# Patient Record
Sex: Male | Born: 1970 | Hispanic: Yes | Marital: Single
Health system: Midwestern US, Community
[De-identification: ages and names within clinical notes are randomized; demographics above are authoritative.]

---

## 2020-02-05 NOTE — Progress Notes (Signed)
Formatting of this note might be different from the original.    Self Swab Type: Anterior Nasal  Electronically signed by Talbert Cage at 02/05/2020 11:02 AM EST

## 2020-12-08 IMAGING — CR [HOSPITAL] ORBITS
2 series · 2 of 2 positions shown · non-contrast
Comparison: None available.

INDICATION: Metal screening.  MRI clearance.
TECHNIQUE: Two views of the orbits are obtained.

[w waters pa]
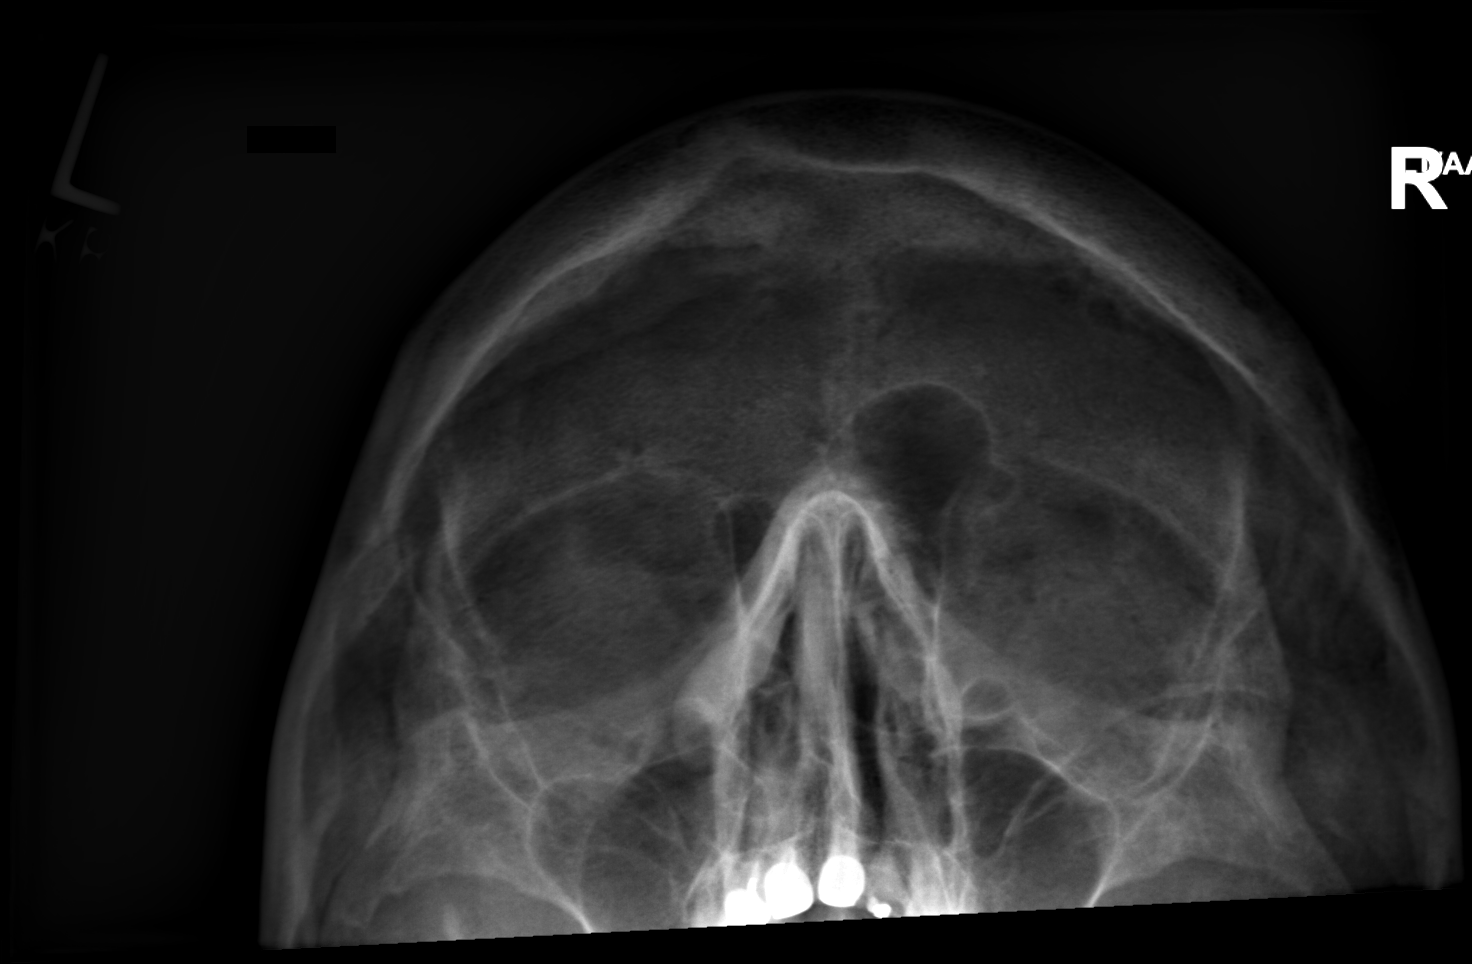

[w orbits lat]
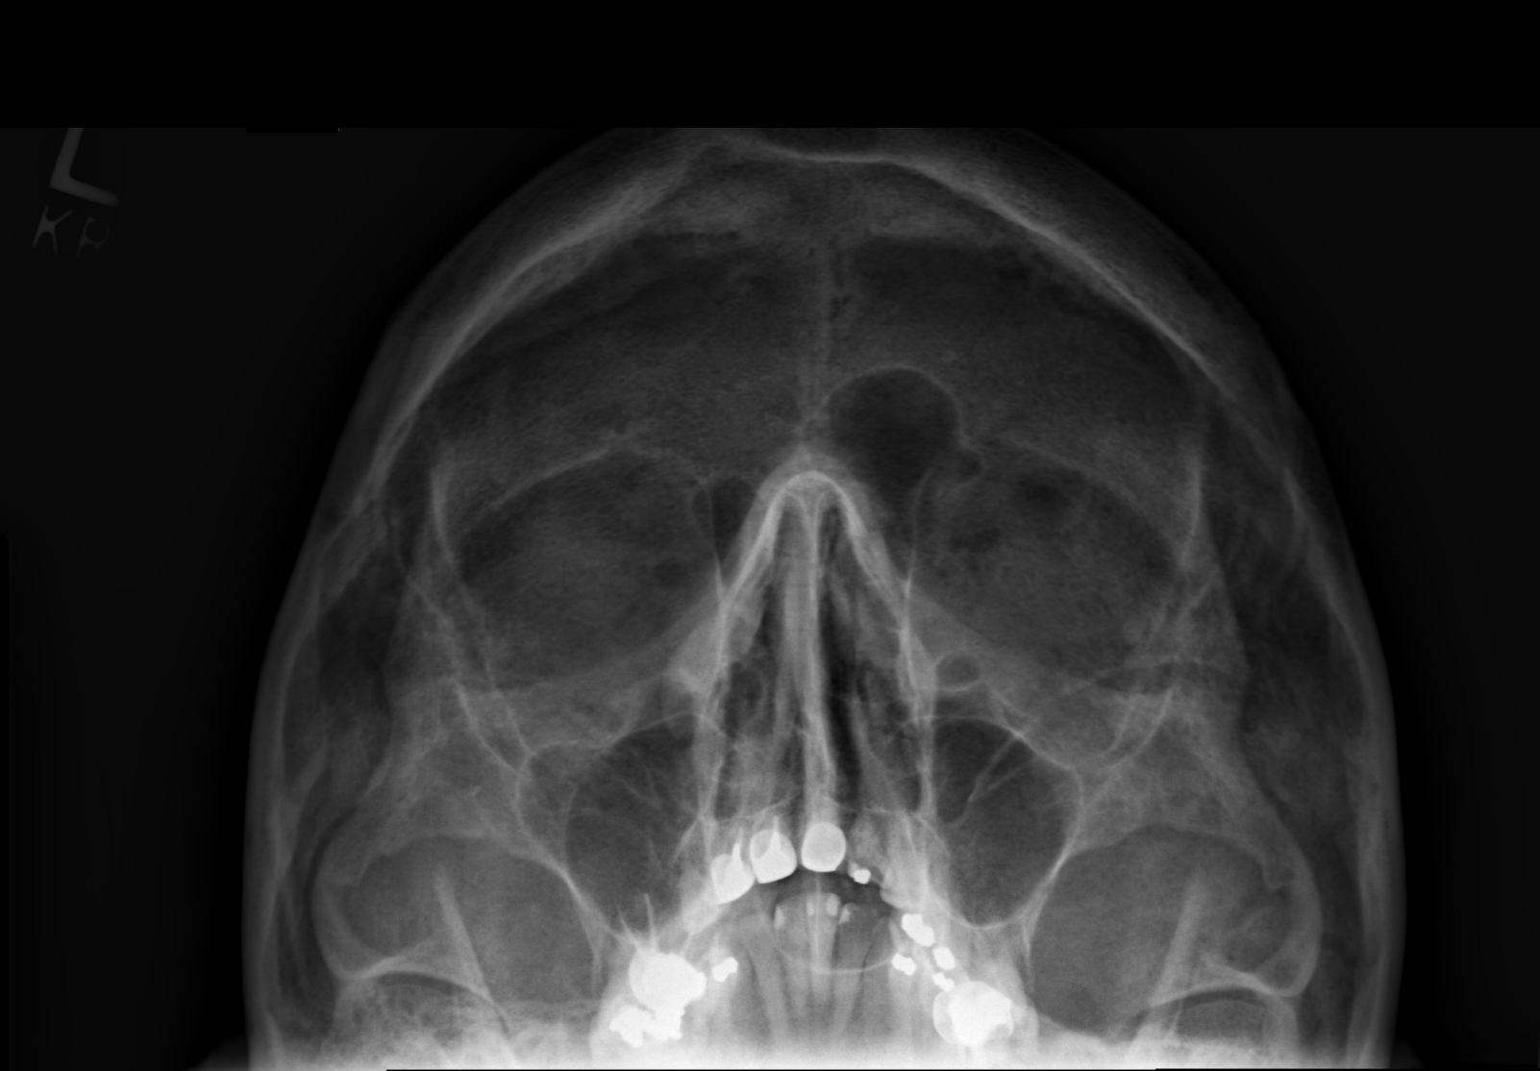

[2 of 2 positions shown; findings below may reference images not displayed]

FINDINGS: No radiopaque foreign body is identified within the orbits. Scattered dental fillings. Paranasal sinuses are well-aerated.
IMPRESSION: No radiopaque foreign body within the orbits.

## 2020-12-08 IMAGING — MR MRI SHOULDER RT WO CONTRAST
5 of 6 series · 33 of 40 positions shown · non-contrast
Comparison: None available.

INDICATION: Right shoulder pain and limited range of motion.
TECHNIQUE: Multiplanar, multisequence MR imaging of the right shoulder without contrast.

[Series 3: t2_axial_obl_fs · axial · 4.0mm · 0.55mm/px · z∈[-54,+32]mm · 6 of 22 slices shown]
[im 1/22]
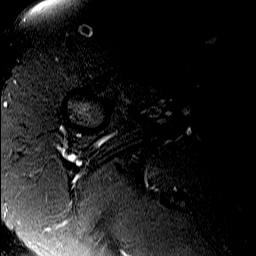
[im 5/22]
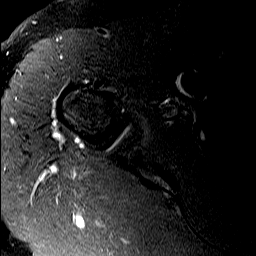
[im 9/22]
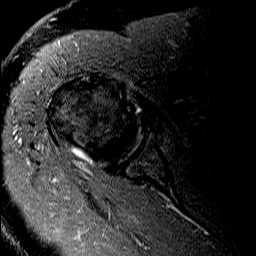
[im 13/22]
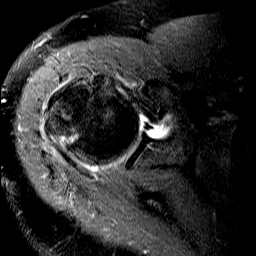
[im 17/22]
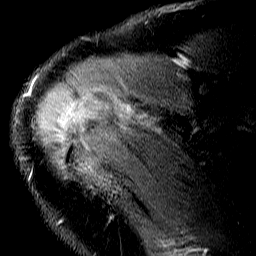
[im 22/22]
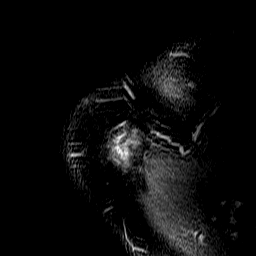

[Series 4: t2_cor_obl_fs · oblique · 4.0mm · 0.55mm/px · 5 of 18 slices shown]
[im 1/18]
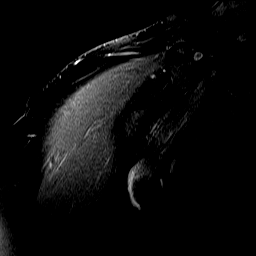
[im 5/18]
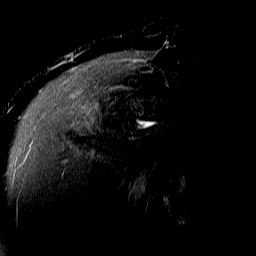
[im 9/18]
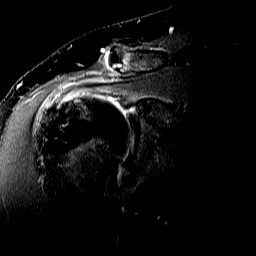
[im 13/18]
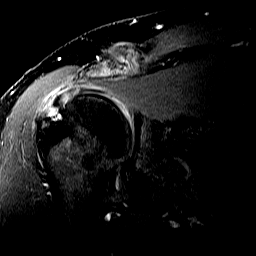
[im 18/18]
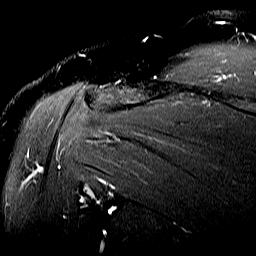

[Series 5: t1_sag_obl · sagittal · 4.0mm · 0.41mm/px · 8 of 27 slices shown]
[im 1/27]
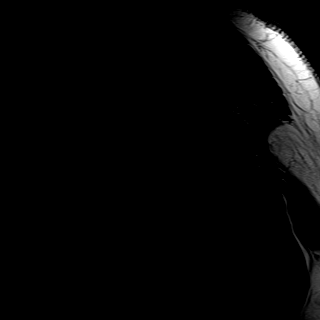
[im 4/27]
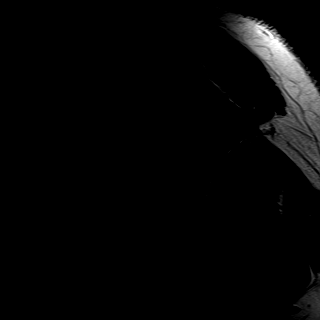
[im 8/27]
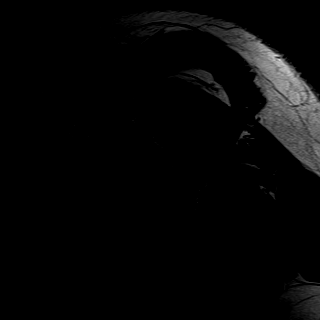
[im 12/27]
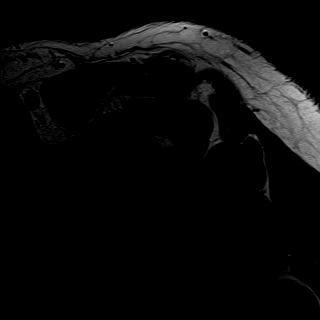
[im 15/27]
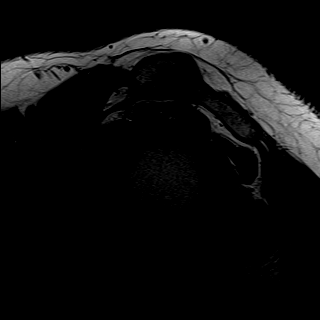
[im 19/27]
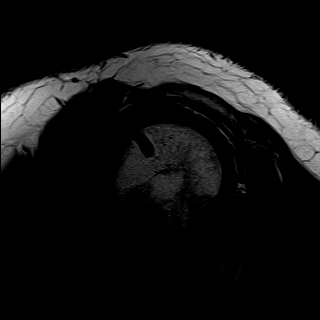
[im 23/27]
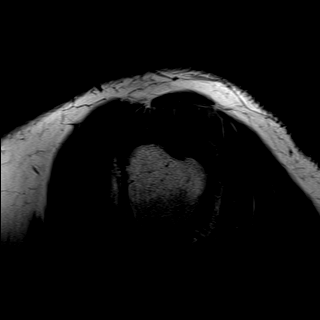
[im 27/27]
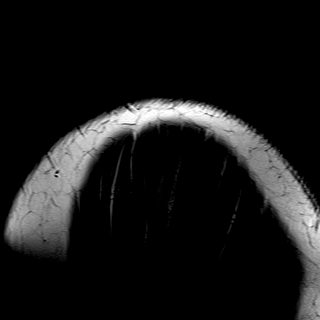

[Series 6: t2_sag_obl_fs · sagittal · 4.0mm · 0.51mm/px · 8 of 27 slices shown]
[im 1/27]
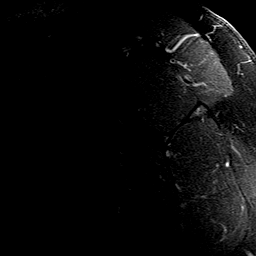
[im 4/27]
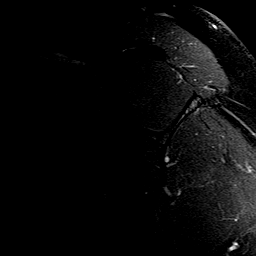
[im 8/27]
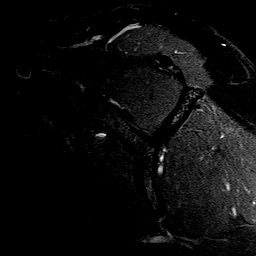
[im 12/27]
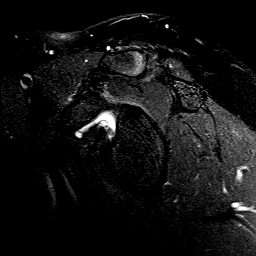
[im 15/27]
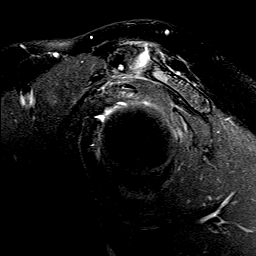
[im 19/27]
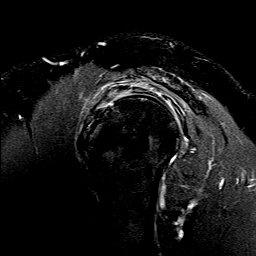
[im 23/27]
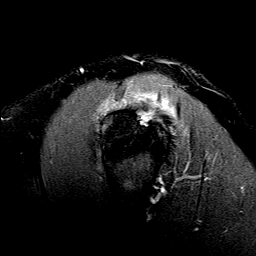
[im 27/27]
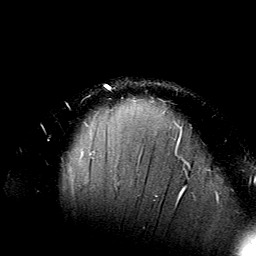

[Series 7: t2_axial_obl_fs_r1 · axial · 4.0mm · 0.55mm/px · z∈[-54,+32]mm · 6 of 22 slices shown]
[im 1/22]
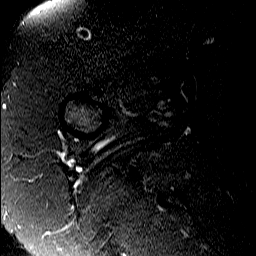
[im 5/22]
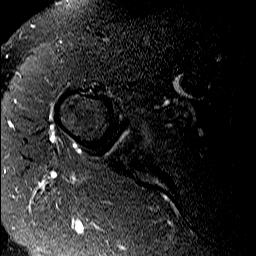
[im 9/22]
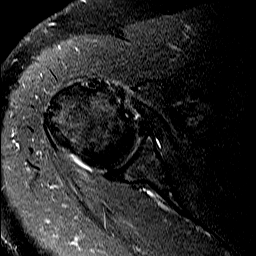
[im 13/22]
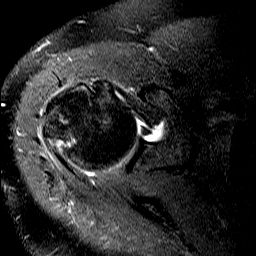
[im 17/22]
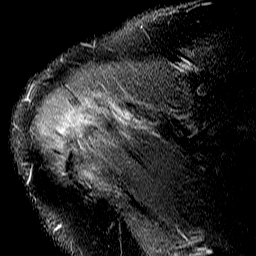
[im 22/22]
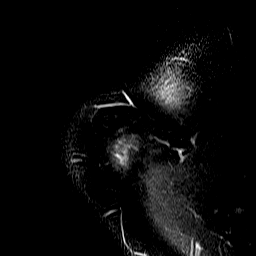

[33 of 40 positions shown; findings below may reference images not displayed]

FINDINGS: OSSEOUS: No acute fracture, avascular necrosis or aggressive osseous lesion. 

ACROMIAL OUTLET: Mild acromioclavicular joint osteoarthritis. A small  amount of fluid is present within the subacromial/subdeltoid bursa. The acromion is nonhooked. The coracoclavicular and coracoacromial ligaments are intact.

ROTATOR CUFF:

Supraspinatus and infraspinatus: Evidence of prior rotator cuff repair, with mild tendinosis and mild articular surface fraying of the supraspinatus tendon, medial to the footprint. Small, intermediate, partial-thickness articular surface tear in the anterior fibers of the infraspinatus tendon footprint, measuring 3 mm. Background infraspinatus tendinosis.

Teres minor: Intact.

Subscapularis: 5 mm low-grade partial-thickness articular surface tear at the tendon insertion.

Fatty atrophy: The rotator cuff muscles are maintained.

LABRUM: Degeneration and tearing of the superior labrum.

BICEPS TENDON: Absence of the proximal intra-articular long head biceps tendon with a few attenuated fibers within the intertubercular groove. These may represent the sequela of prior biceps tenotomy or tendon tear.

GLENOHUMERAL JOINT: The glenohumeral articular cartilage is maintained. No focal chondral defect is identified. The glenohumeral joint is normal in alignment.

OTHER: The inferior glenohumeral ligament is unremarkable. No glenohumeral joint effusion.
IMPRESSION: 1.
Evidence of prior rotator cuff repair with mild supraspinatus tendinosis and articular surface fraying. No full-thickness tear.

2.
Mild infraspinatus tendinosis with a small intermediate-grade partial-thickness articular surface tear of the anterior tendon insertion.

3.
Degeneration and tearing of the superior labrum, with evidence of prior biceps tenotomy or tear.

4.
Small, low-grade partial-thickness articular surface tear of the subscapularis tendon footprint.

STAT Fax

## 2022-03-27 IMAGING — MR MRI ELBOW RT WO CONTRAST
5 series · 40 of 40 positions shown · non-contrast
Comparison: None.

INDICATION: Right elbow sprain
TECHNIQUE: Multiplanar, multiecho imaging of the right elbow was performed, including T1-weighted and fluid sensitive sequences without intravenous contrast.

[Series 4: t1_axial · axial · right · 3.0mm · 0.34mm/px · z∈[-25,+75]mm · 9 of 32 slices shown]
[im 1/32]
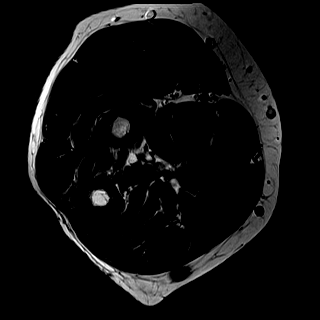
[im 4/32]
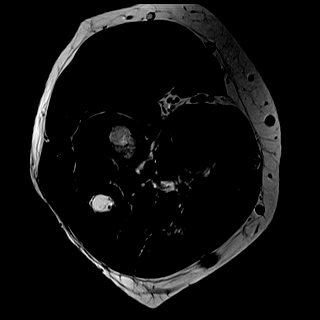
[im 8/32]
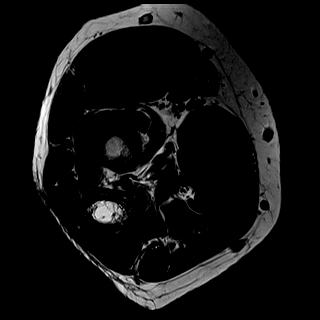
[im 12/32]
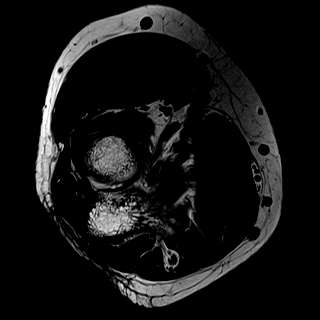
[im 16/32]
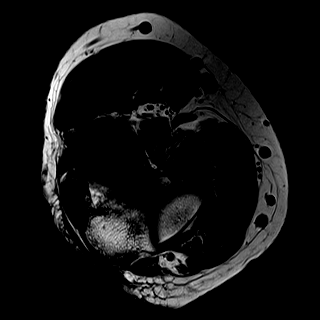
[im 20/32]
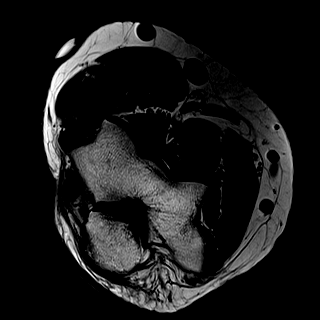
[im 24/32]
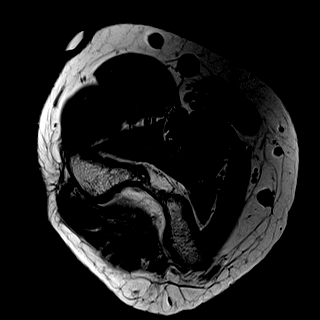
[im 28/32]
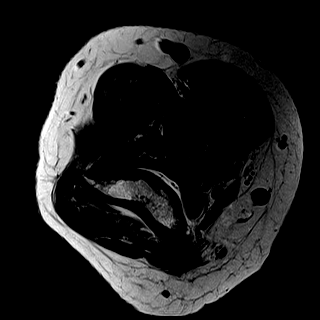
[im 32/32]
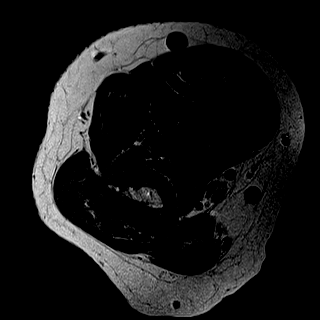

[Series 5: t2_axial_fs · axial · right · 3.0mm · 0.43mm/px · z∈[-25,+75]mm · 9 of 32 slices shown]
[im 1/32]
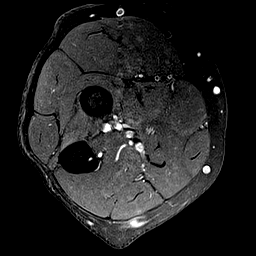
[im 4/32]
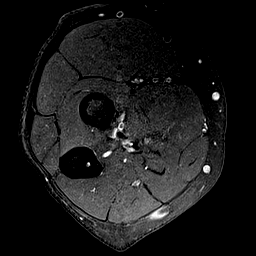
[im 8/32]
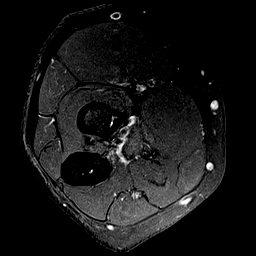
[im 12/32]
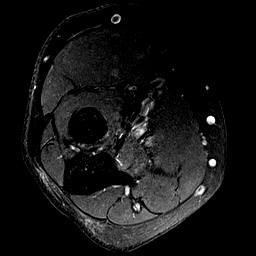
[im 16/32]
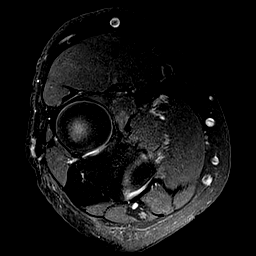
[im 20/32]
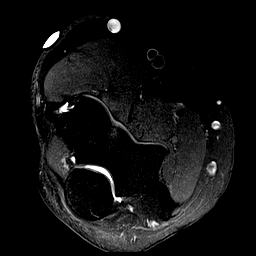
[im 24/32]
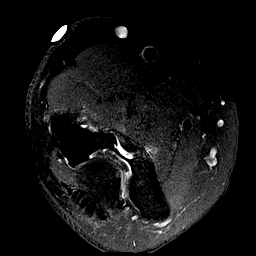
[im 28/32]
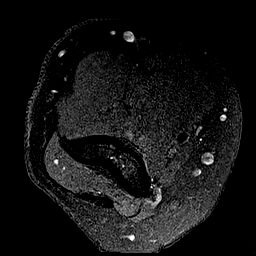
[im 32/32]
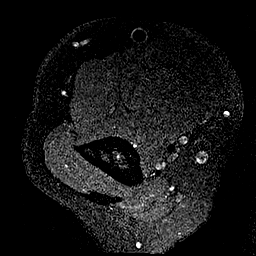

[Series 6: t1_cor · coronal · right · 3.0mm · 0.38mm/px · 7 of 22 slices shown]
[im 1/22]
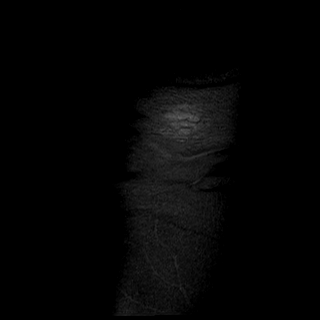
[im 4/22]
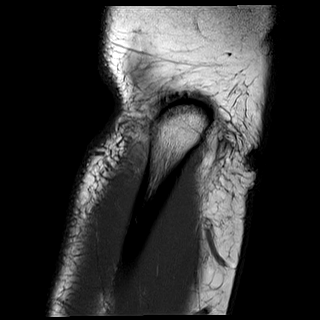
[im 8/22]
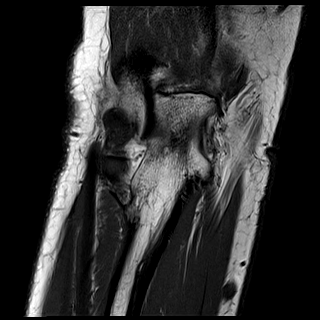
[im 11/22]
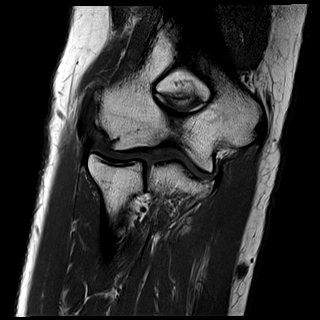
[im 15/22]
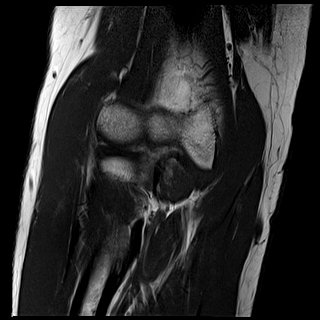
[im 18/22]
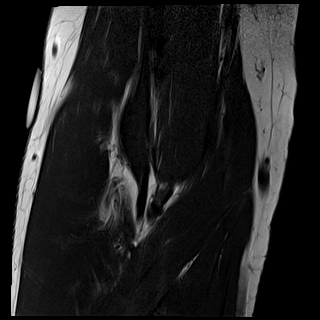
[im 22/22]
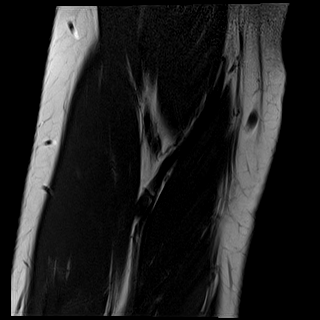

[Series 7: t2_cor_fs · coronal · right · 3.0mm · 0.23mm/px · 7 of 22 slices shown]
[im 1/22]
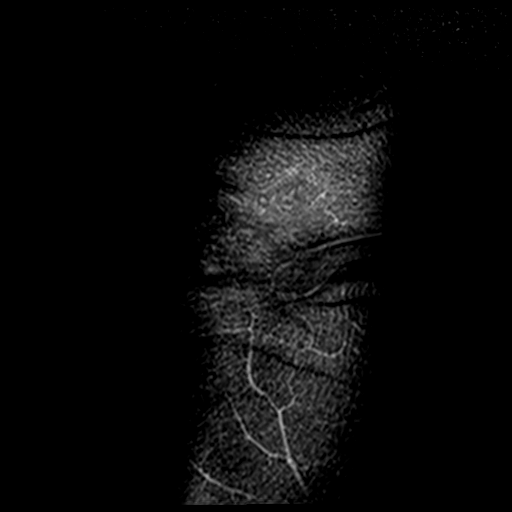
[im 4/22]
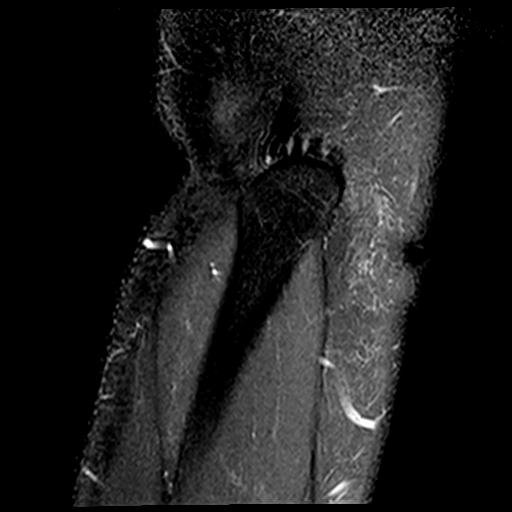
[im 8/22]
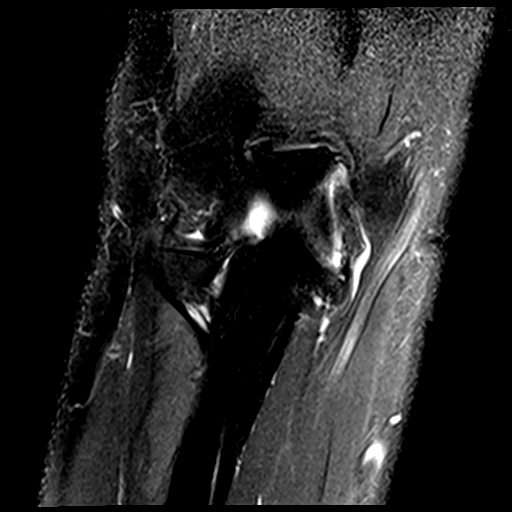
[im 11/22]
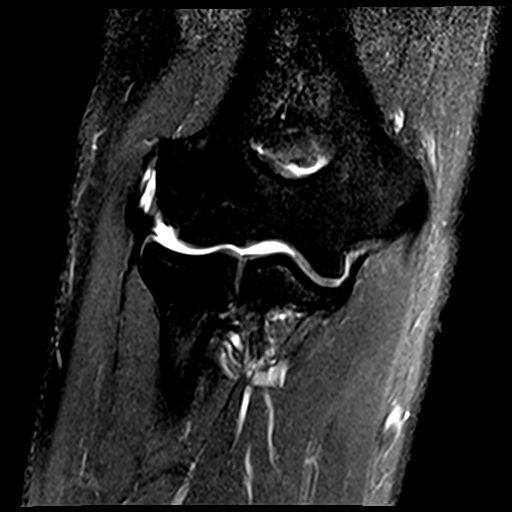
[im 15/22]
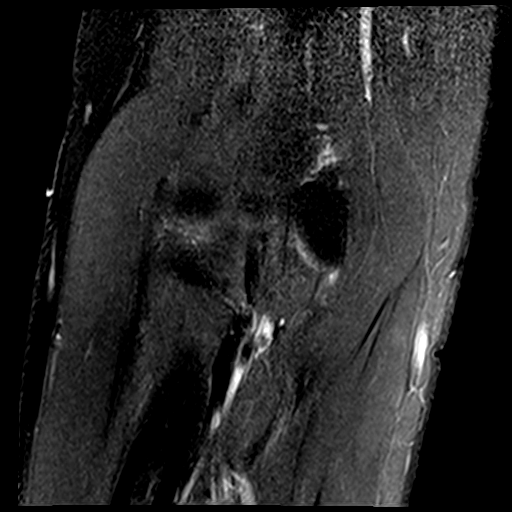
[im 18/22]
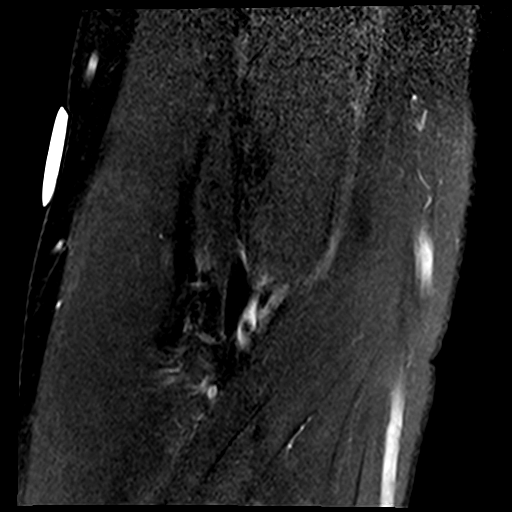
[im 22/22]
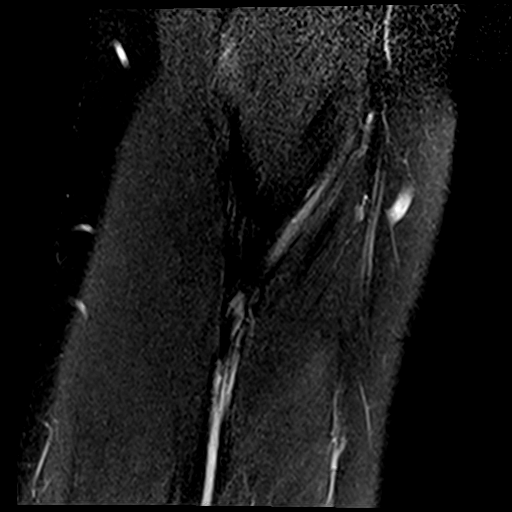

[Series 8: t2_sag_fs · sagittal · right · 3.0mm · 0.47mm/px · 8 of 27 slices shown]
[im 1/27]
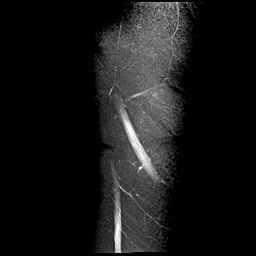
[im 4/27]
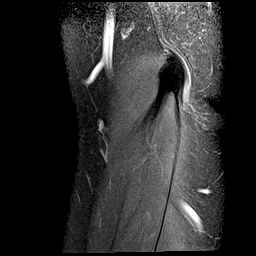
[im 8/27]
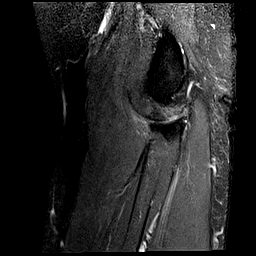
[im 12/27]
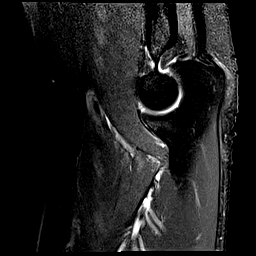
[im 15/27]
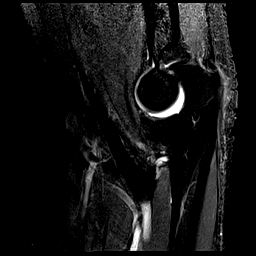
[im 19/27]
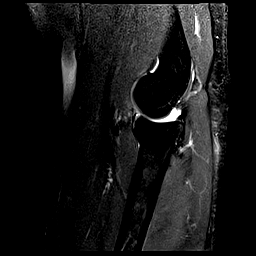
[im 23/27]
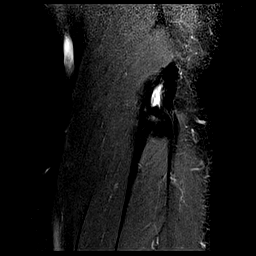
[im 27/27]
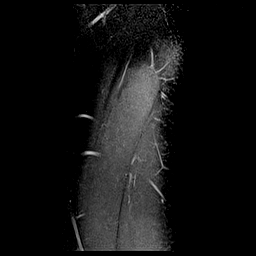

[40 of 40 positions shown; findings below may reference images not displayed]

FINDINGS: Common extensor tendon: Large high-grade partial-thickness tear, at the proximal attachment.

Radial collateral ligament: Mild scarring and irregularity at the proximal attachment.

Lateral ulnar collateral ligament: Intact.

Common flexor tendon: Intact.

Ulnar collateral ligament: Intact.

Biceps tendon: Intact.

Brachialis tendon: Intact.

Triceps tendon: Intact.

Cartilage: Intact.

Nerves: Normal size and signal intensity.

No acute fracture. No joint effusion. No erosion. No acute muscle injury. No olecranon bursitis.
IMPRESSION: Large high-grade partial-thickness tear of the common extensor tendon, at the proximal attachment.

## 2022-03-27 IMAGING — MR MRI SHOULDER LT WO CONTRAST
4 series · 40 of 40 positions shown · non-contrast
Comparison: MRI right shoulder study dated 12/08/2020.

HISTORY: 50-year-old Male with left shoulder sprain
TECHNIQUE: MRI study of the left shoulder was performed using coronal oblique, sagittal oblique and axial images of varying sequences.

[Series 5: t2_axial_fs · axial · left · 3.0mm · 0.55mm/px · z∈[-28,+63]mm · 8 of 24 slices shown]
[im 1/24]
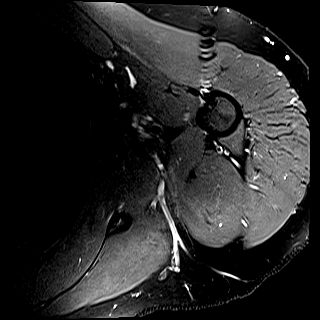
[im 4/24]
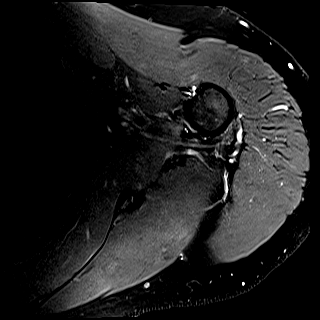
[im 7/24]
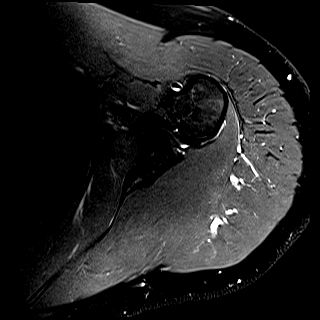
[im 10/24]
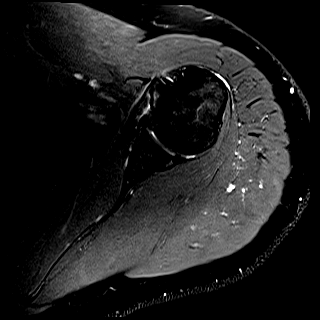
[im 14/24]
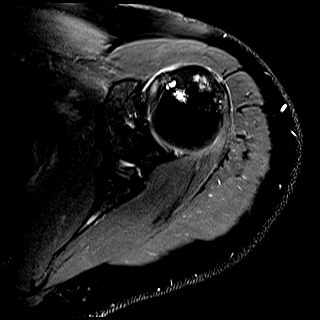
[im 17/24]
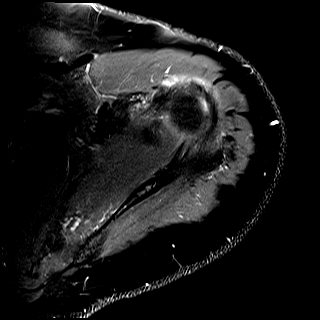
[im 20/24]
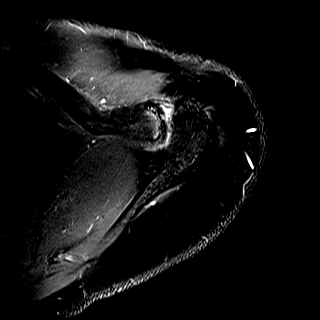
[im 24/24]
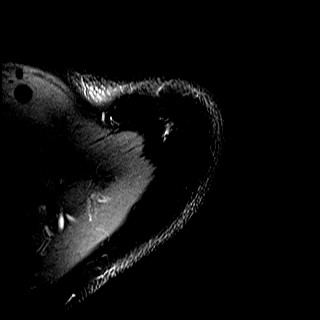

[Series 6: t2_cor_obl_fs · oblique · left · 3.0mm · 0.50mm/px · 10 of 25 slices shown]
[im 1/25]
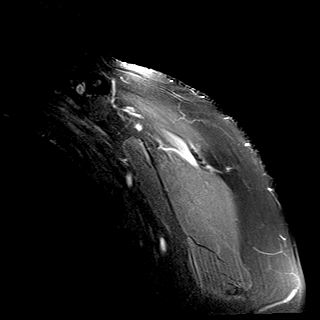
[im 3/25]
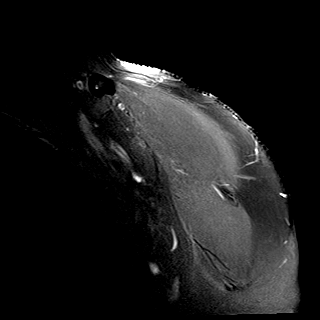
[im 6/25]
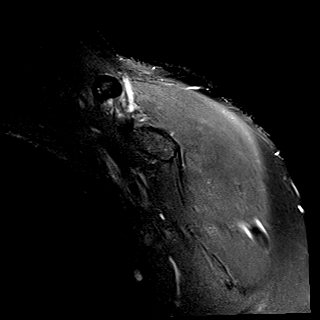
[im 9/25]
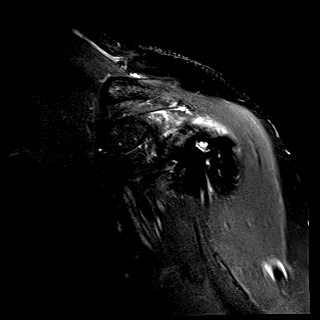
[im 11/25]
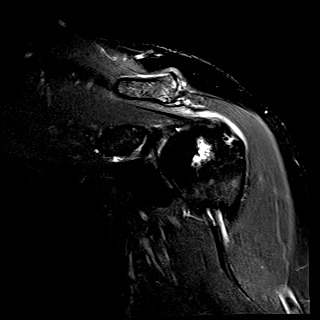
[im 14/25]
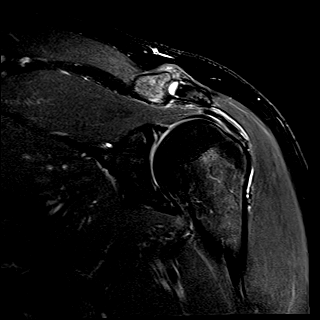
[im 17/25]
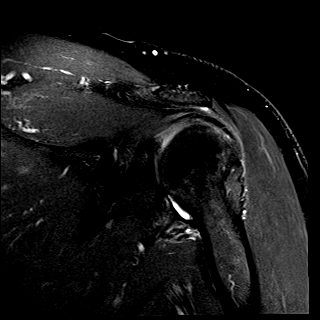
[im 19/25]
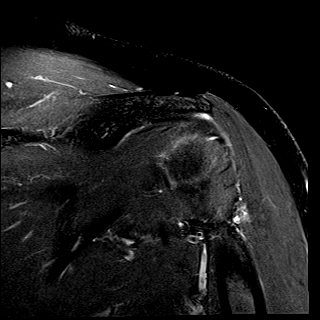
[im 22/25]
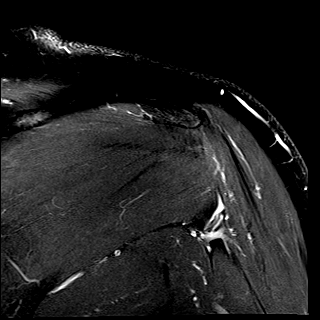
[im 25/25]
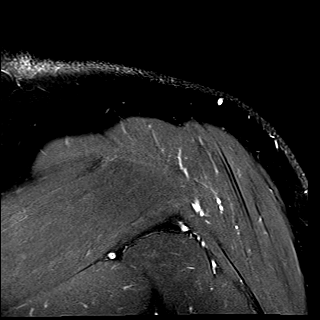

[Series 7: t2_sag_obl_fs · oblique · left · 3.0mm · 0.48mm/px · 11 of 28 slices shown]
[im 1/28]
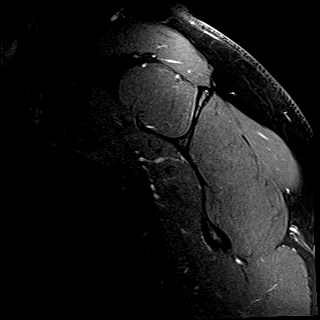
[im 3/28]
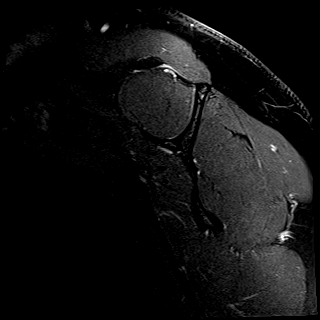
[im 6/28]
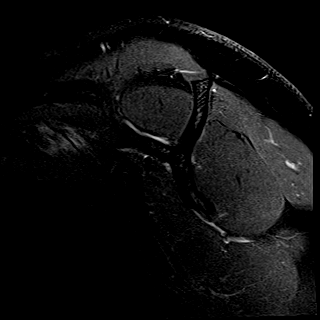
[im 9/28]
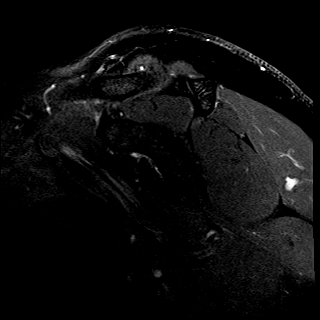
[im 11/28]
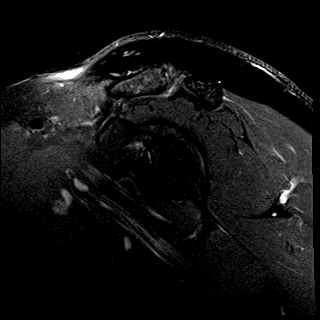
[im 14/28]
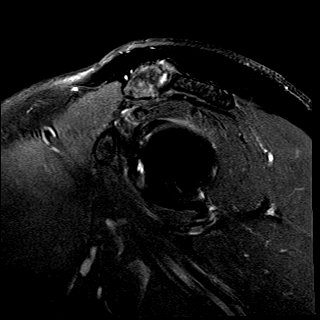
[im 17/28]
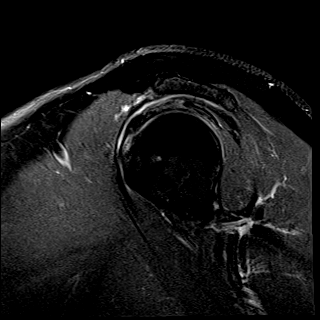
[im 19/28]
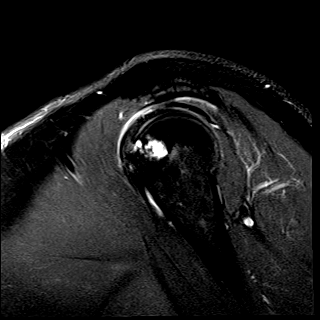
[im 22/28]
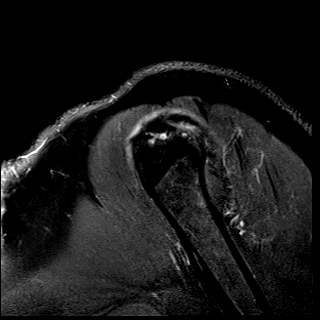
[im 25/28]
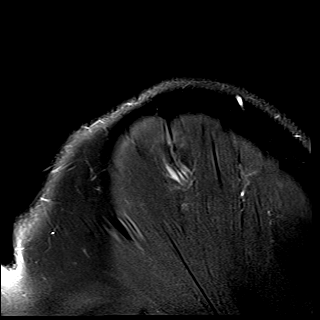
[im 28/28]
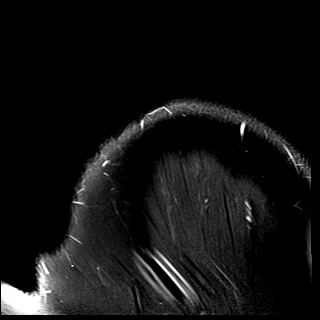

[Series 8: t1_sag_obl · oblique · left · 3.0mm · 0.40mm/px · 11 of 28 slices shown]
[im 1/28]
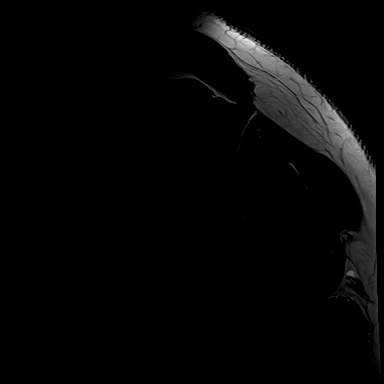
[im 3/28]
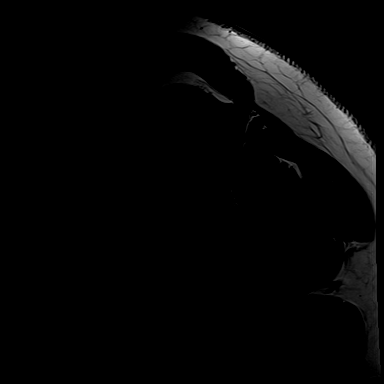
[im 6/28]
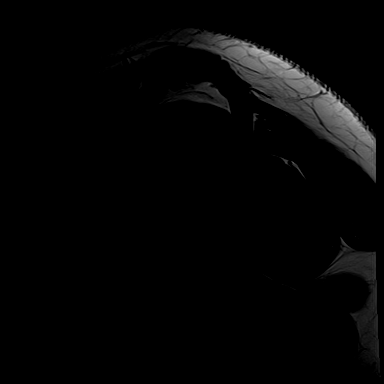
[im 9/28]
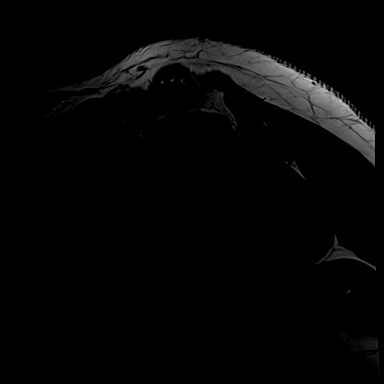
[im 11/28]
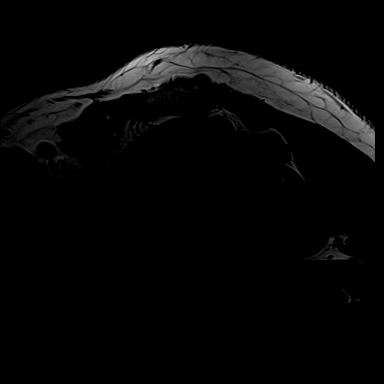
[im 14/28]
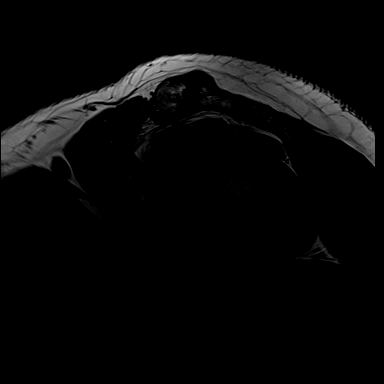
[im 17/28]
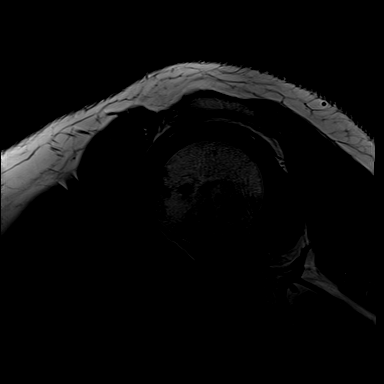
[im 19/28]
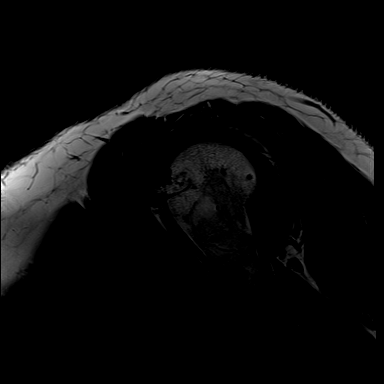
[im 22/28]
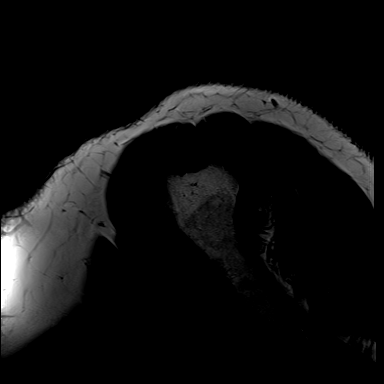
[im 25/28]
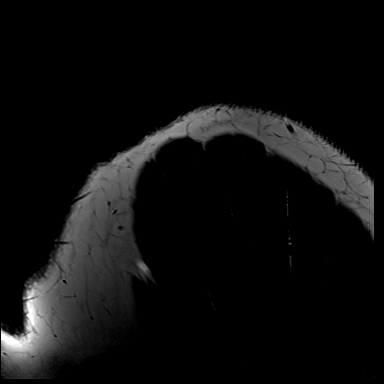
[im 28/28]
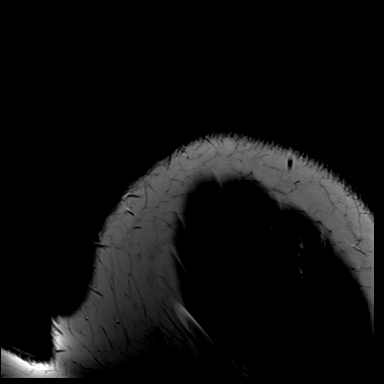

[40 of 40 positions shown; findings below may reference images not displayed]

FINDINGS: OSSEOUS: No acute fracture, avascular necrosis or aggressive osseous lesion. 

ACROMIAL OUTLET: 

Acromioclavicular joint osteoarthritis: Moderate

Acromion: Non hooked.

Subacromial/subdeltoid bursa fluid: No significant fluid.

Coracoclavicular and coracoacromial ligaments: Intact.

ROTATOR CUFF:

Supraspinatus and infraspinatus: High-grade partial tear identified in the supraspinatus tendon measuring maximum 7.3 mm in transverse x 7.4 mm in AP dimension. Fluid is identified in the intrasubstance of the infraspinatus tendon likely related to the tear.

Teres minor: Intact.

Subscapularis: Increased signal and thickening identified at the insertion site on the lesser tuberosity, compatible with tendinosis.

Rotator cuff muscles: No fatty atrophy or intramuscular edema.

LABRUM: Superior labrum tear that extends anterior to posteriorly.

BICEPS TENDON: Increased signal proximal long head of biceps tendon compatible with tendinosis.

GLENOHUMERAL JOINT: The glenohumeral articular cartilage is maintained. No focal chondral defect is identified. The glenohumeral joint is normal in alignment.

OTHER: The inferior glenohumeral ligament is unremarkable. No glenohumeral joint effusion.
IMPRESSION: 1.
High-grade partial tear involving the supraspinatus tendon measuring 7.3 mm in transverse x 7.4 mm in AP dimensions. There is no significant atrophy involving the supraspinatus and infraspinatus muscles.

2.
Tendinosis identified in the subscapularis and proximal long head of biceps tendon.

3.
Superior labrum tear that extends anterior to posteriorly.

4.
Moderate AC joint osteoarthritis.

## 2022-09-14 ENCOUNTER — Inpatient Hospital Stay
Admit: 2022-09-14 | Discharge: 2022-09-15 | Disposition: A | Discharge status: Home / Self Care | Attending: Emergency Medicine

## 2022-09-14 DIAGNOSIS — M5432 Sciatica, left side: Secondary | ICD-10-CM

## 2022-09-14 DIAGNOSIS — M5442 Lumbago with sciatica, left side: Secondary | ICD-10-CM

## 2022-09-14 NOTE — ED Triage Notes (Signed)
Pt in ED with c/o left lower back pain that radiates down his left leg to his knee x 2 years. Pt said he has been to the chiropractor but it has not helped, last motrin was 4pm.

## 2022-09-14 NOTE — ED Notes (Signed)
I have reviewed discharge instructions with the patient.  The patient verbalized understanding.      Trent Theisen, [place], RN  09/14/22 2037

## 2022-09-14 NOTE — ED Notes (Signed)
Pt arrives ambulatory to ED bed with c/o lower back pain with radiation down the left hip and left leg. He states he has been having the pain for the past 2 years but it has been getting worse. He states he has seen a Restaurant manager, fast food. Denies any recent falls/injuries. Denies any bowel/urinary complaints. He is AAO x 4 in NAD. Respirations regular/unlabored. Denies additional complaints.      Marlisa Caridi, [place], RN  09/14/22 2015

## 2022-09-14 NOTE — ED Provider Notes (Signed)
[facility] EMERGENCY DEPT  EMERGENCY DEPARTMENT ENCOUNTER      Pt Name: Albert Owen  MRN: [id number]  Birthdate 1971/07/22  Date of evaluation: 09/14/2022  Provider: Rhoderick Moody, DO      HISTORY OF PRESENT ILLNESS      51 year old male with history of scoliosis, presents to the emergency department with his family member noting acute on chronic left-sided low back pain with radiation of pain down left lower extremity that is worse with bending and ambulation.  No associated numbness or weakness or incontinence or abdominal pain.  Symptoms acutely worsen last night.  He has not taken anything for his symptoms.  He denies any falls or trauma to the area recently.    The history is provided by the patient and a relative. The history is limited by a language barrier. A language interpreter was used.           Nursing Notes were reviewed.    REVIEW OF SYSTEMS         Review of Systems        PAST MEDICAL HISTORY   No past medical history on file.      SURGICAL HISTORY     No past surgical history on file.      CURRENT MEDICATIONS       Previous Medications    No medications on file       ALLERGIES     Patient has no known allergies.    FAMILY HISTORY     No family history on file.       SOCIAL HISTORY       Social History     Socioeconomic History    Marital status: Single         PHYSICAL EXAM       ED Triage Vitals [09/14/22 1958]   BP Temp Temp Source Pulse Respirations SpO2 Height Weight - Scale   (!) 154/83 98 F (36.7 C) Oral 79 16 98 % 5\' 2"  (1.575 m) 160 lb (72.6 kg)       Body mass index is 29.26 kg/m.    Physical Exam  Vitals and nursing note reviewed.   Constitutional:       General: He is not in acute distress.     Appearance: Normal appearance. He is not ill-appearing.      Comments: Very pleasant gentleman, no acute distress.  Ambulatory without difficulty.   HENT:      Head: Normocephalic and atraumatic.      Nose: Nose normal.      Mouth/Throat:      Mouth: Mucous membranes are moist.   Eyes:       Extraocular Movements: Extraocular movements intact.      Conjunctiva/sclera: Conjunctivae normal.      Pupils: Pupils are equal, round, and reactive to light.   Cardiovascular:      Rate and Rhythm: Normal rate and regular rhythm.      Heart sounds: Normal heart sounds.   Pulmonary:      Effort: Pulmonary effort is normal.      Breath sounds: Normal breath sounds.   Abdominal:      General: There is no distension.      Palpations: Abdomen is soft.      Tenderness: There is no abdominal tenderness.   Musculoskeletal:      Cervical back: Neck supple.      Lumbar back: Tenderness and bony tenderness present. Positive left straight leg raise test. Negative right  straight leg raise test. Scoliosis present.        Back:    Skin:     General: Skin is warm and dry.   Neurological:      General: No focal deficit present.      Mental Status: He is alert and oriented to person, place, and time.             EMERGENCY DEPARTMENT COURSE and DIFFERENTIAL DIAGNOSIS/MDM:   Vitals:    Vitals:    09/14/22 1958   BP: (!) 154/83   Pulse: 79   Resp: 16   Temp: 98 F (36.7 C)   TempSrc: Oral   SpO2: 98%   Weight: 72.6 kg (160 lb)   Height: 1.575 m (5\' 2" )         Medical Decision Making  Risk  Prescription drug management.      51 year old male with history of scoliosis presents with acute on chronic left-sided low back pain and sciatica.  Afebrile with vital signs stable no acute distress.  No reported trauma or midline tenderness to suggest acute bony injury.  Do not feel that advanced imaging is necessary at this time.  No signs or symptoms suggestive of cauda equina.  He was treated symptomatically in the ED with Toradol, lidocaine patch, Robaxin and Rx Medrol Dosepak, lidocaine patches, Robaxin for further symptomatic relief.  Recommended orthopedics follow-up for further evaluation return precautions were given for worsening or concerns.  This plan was discussed with the patient and his family member at the bedside and they stated  with understanding and agreement.      REASSESSMENT          CONSULTS:  None    PROCEDURES:     Procedures            (Please note that portions of this note were completed with a voice recognition program.  Efforts were made to edit the dictations but occasionally words are mis-transcribed.)    Rockne Menghini, DO (electronically signed)  Emergency Attending Physician              Zenovia Jarred, DO  09/14/22 2307

## 2022-09-15 MED ORDER — LIDOCAINE 5 % EX PTCH
5 | MEDICATED_PATCH | Freq: Every day | CUTANEOUS | 0 refills | Status: AC
Start: 2022-09-15 — End: 2022-10-14

## 2022-09-15 MED ORDER — LIDOCAINE 4 % EX PTCH
4 | CUTANEOUS | Status: DC
Start: 2022-09-15 — End: 2022-09-14
  Administered 2022-09-15: 1 via TRANSDERMAL

## 2022-09-15 MED ORDER — METHYLPREDNISOLONE 4 MG PO TBPK
4 | ORAL_TABLET | ORAL | 0 refills | Status: AC
Start: 2022-09-15 — End: 2022-09-20

## 2022-09-15 MED ORDER — KETOROLAC TROMETHAMINE 30 MG/ML IJ SOLN
30 | INTRAMUSCULAR | Status: AC
Start: 2022-09-15 — End: 2022-09-14
  Administered 2022-09-15: 30 mg via INTRAMUSCULAR

## 2022-09-15 MED ORDER — METHOCARBAMOL 750 MG PO TABS
750 | ORAL_TABLET | Freq: Three times a day (TID) | ORAL | 0 refills | Status: AC | PRN
Start: 2022-09-15 — End: 2022-09-24

## 2022-09-15 MED ORDER — METHOCARBAMOL 500 MG PO TABS
500 | ORAL | Status: AC
Start: 2022-09-15 — End: 2022-09-14
  Administered 2022-09-15: 01:00:00 750 mg via ORAL

## 2022-09-15 MED FILL — METHOCARBAMOL 500 MG PO TABS: 500 MG | ORAL | Qty: 2

## 2022-09-15 MED FILL — LIDOCAINE PAIN RELIEF 4 % EX PTCH: 4 % | CUTANEOUS | Qty: 1

## 2022-09-15 MED FILL — KETOROLAC TROMETHAMINE 30 MG/ML IJ SOLN: 30 MG/ML | INTRAMUSCULAR | Qty: 1

## 2024-05-03 ENCOUNTER — Emergency Department: Admit: 2024-05-04

## 2024-05-03 DIAGNOSIS — E876 Hypokalemia: Secondary | ICD-10-CM

## 2024-05-03 NOTE — ED Provider Notes (Signed)
 [facility] EMERGENCY DEPARTMENT  EMERGENCY DEPARTMENT ENCOUNTER      Pt Name: Albert Owen  MRN: [id number]  Birthdate [birth year]  Date of evaluation: 05/03/2024  Provider: Mignon Alberts, DO    CHIEF COMPLAINT       Chief Complaint   Patient presents with    Generalized Body Aches       PMH No past medical history on file.      MDM:   Vitals:    Vitals:    05/04/24 0145   BP: (!) 141/83   Pulse: 73   Resp: 16   Temp: 98.2 F (36.8 C)   SpO2: 100%           This is a 53 y.o. male with no significant pmhx who presents today for cc of body aches . Patient is spanish speaking only, interpreter utilized for entirety of interaction. Patient notes intermittent aches and pains in his body for several months, and occasionally has nosebleeds. Had a nosebleed this morning which has since resolved. Takes no blood thinners, denies large volume blood loss. No humidifiers at home. Frequency of nosebleeds is once or twice a week once or twice a month. Otherwise denies chest pain, dyspnea, fever, chills, abdominal pain, nausea, vomiting, urinary symptoms, and changes in bowel habits.    On arrival VS stable.     Physical Exam  General: Alert, no acute distress  HEENT: Normocephalic, atraumatic. EOMI, dry oral mucosa, no conjunctival injection. Nares clear b/l, no bleeding or significant scabs noted.   Neck: ROM normal, supple  Cardio: Heart regular rate and regular rhythm, cap refill <2seconds  Lungs: no respiratory distress, lungs CTAB, no wheezes, rhonchi or rales   Abdomen: abdomen soft, nontender  MSK:ROM normal, no LE edema  Skin: Warm, dry, no rash  Neuro: No focal neurodeficits, AOx3     Patient does appear quite dry, will give fluids for symptom control.      Labs notable for mild hypokalemia, repleted.  EKG nonischemic, CXR clear.     Patient re-evaluated, feeling well. Reassuring workup today, no significant coagulopathies noted or severe derangements. He will need to follow up with PCP for more  chronic complaints. They are agreeable and requesting discharge. Low suspicion for more sinister process given benign exam and reassuring workup. Will have them follow up, close return precautions discussed, they are agreeable.     Diagnosis is hypokalemia. Disposition is Discharge with PCP follow-up. Workup and plan discussed with patient  and family , all questions answered and they are in agreement with the  plan .             ED Course as of 05/04/24 0204   Thu May 03, 2024   2210 EKG rate 61bpm, sinus rhythm, no STEMI [MG]      ED Course User Index  [MG] Mignon Alberts, DO         Total critical care time (not including time spent performing separately reportable procedures):         Review of external notes and Independent historians utilized in decision making: Not applicable    Diagnostics independently interpreted by me: EKG see ED course for interpretation    Discussions with other clinicians and healthcare agents:  None    Risks considered in patient's treatment plan: IV medications given (see MAR)        HISTORY OF PRESENT ILLNESS   (Location/Symptom, Timing/Onset, Context/Setting, Quality, Duration, Modifying Factors, Severity)  Note limiting factors.  See MDM    Nursing Notes were reviewed.    REVIEW OF SYSTEMS    (2-9 systems for level 4, 10 or more for level 5)   See MDM    PAST MEDICAL HISTORY   No past medical history on file.    SURGICAL HISTORY     No past surgical history on file.    CURRENT MEDICATIONS       Previous Medications    No medications on file       ALLERGIES     Patient has no known allergies.    FAMILY HISTORY     No family history on file.       SOCIAL HISTORY       Social History     Socioeconomic History    Marital status: Single       PHYSICAL EXAM    (up to 7 for level 4, 8 or more for level 5)     ED Triage Vitals   BP Systolic BP Percentile Diastolic BP Percentile Temp Temp Source Pulse Respirations SpO2   05/03/24 2120 -- -- 05/03/24 2120 05/03/24 2120 05/03/24 2120  05/03/24 2120 05/03/24 2120   (!) 149/79   98.6 F (37 C) Oral 60 16 98 %      Height Weight - Scale         05/03/24 2118 05/03/24 2118         1.64 m (5' 4.57") 65.7 kg (144 lb 13.5 oz)             Body mass index is 24.43 kg/m.    Physical Exam    See mdm    DIAGNOSTIC RESULTS     EKG: All EKG's are interpreted by the Emergency Department Physician who either signs or Co-signs this chart in the absence of a cardiologist.        RADIOLOGY:   Non-plain film images such as CT, Ultrasound and MRI are read by the radiologist.    Interpretation per the Radiologist below, if available at the time of this note:    XR CHEST (2 VW)   Final Result      No acute process         Electronically signed by [facility]           LABS:  Labs Reviewed   COMPREHENSIVE METABOLIC PANEL - Abnormal; Notable for the following components:       Result Value    Potassium 3.3 (*)     All other components within normal limits   URINE CULTURE HOLD SAMPLE   CBC WITH AUTO DIFFERENTIAL   EXTRA TUBES HOLD   MAGNESIUM   URINALYSIS WITH MICROSCOPIC       All other labs were within normal range or not returned as of this dictation.        PROCEDURES:  Unless otherwise noted below, none     Procedures    See MDM for documentation of critical care time.       FINAL IMPRESSION    No diagnosis found.      DISPOSITION/PLAN   DISPOSITION        PATIENT REFERRED TO:  No follow-up provider specified.    DISCHARGE MEDICATIONS:  New Prescriptions    No medications on file         (Please note that portions of this note were completed with a voice recognition program.  Efforts were made to edit the dictations but occasionally words are  mis-transcribed.)    Mignon Alberts, DO (electronically signed)  Emergency Attending Physician / Physician Assistant / Nurse Practitioner             Mignon Alberts, DO  05/06/24 [phone]

## 2024-05-03 NOTE — ED Triage Notes (Addendum)
 Pt presents to the ED w/ c/o generalized body aches and weakness for the past month and intermittent nose bleeds for the past 5-6 months. Pt does not have PCP, nor is he having nose bleed at this time. His last nose bleed was this morning.

## 2024-05-04 ENCOUNTER — Inpatient Hospital Stay
Admit: 2024-05-04 | Discharge: 2024-05-04 | Disposition: A | Discharge status: Home / Self Care | Attending: Student in an Organized Health Care Education/Training Program

## 2024-05-04 LAB — EXTRA TUBES HOLD

## 2024-05-04 LAB — CBC WITH AUTO DIFFERENTIAL
Basophils %: 0.5 % (ref 0.0–1.0)
Basophils Absolute: 0.04 10*3/uL (ref 0.00–0.10)
Eosinophils %: 1.7 % (ref 0.0–7.0)
Eosinophils Absolute: 0.13 10*3/uL (ref 0.00–0.40)
Hematocrit: 42 % (ref 36.6–50.3)
Hemoglobin: 14.5 g/dL (ref 12.1–17.0)
Immature Granulocytes %: 0.3 % (ref 0.0–0.5)
Immature Granulocytes Absolute: 0.02 10*3/uL (ref 0.00–0.04)
Lymphocytes %: 25.2 % (ref 12.0–49.0)
Lymphocytes Absolute: 1.97 10*3/uL (ref 0.80–3.50)
MCH: 30.4 pg (ref 26.0–34.0)
MCHC: 34.5 g/dL (ref 30.0–36.5)
MCV: 88.1 FL (ref 80.0–99.0)
MPV: 9.6 FL (ref 8.9–12.9)
Monocytes %: 9.6 % (ref 5.0–13.0)
Monocytes Absolute: 0.75 10*3/uL (ref 0.00–1.00)
Neutrophils %: 62.7 % (ref 32.0–75.0)
Neutrophils Absolute: 4.91 10*3/uL (ref 1.80–8.00)
Nucleated RBCs: 0 /100{WBCs}
Platelets: 251 10*3/uL (ref 150–400)
RBC: 4.77 M/uL (ref 4.10–5.70)
RDW: 12.5 % (ref 11.5–14.5)
WBC: 7.8 10*3/uL (ref 4.1–11.1)
nRBC: 0 10*3/uL (ref 0.00–0.01)

## 2024-05-04 LAB — COMPREHENSIVE METABOLIC PANEL
ALT: 43 U/L (ref 12–78)
AST: 29 U/L (ref 15–37)
Albumin/Globulin Ratio: 1.2 (ref 1.1–2.2)
Albumin: 3.8 g/dL (ref 3.5–5.0)
Alk Phosphatase: 104 U/L (ref 45–117)
Anion Gap: 3 mmol/L (ref 2–12)
BUN/Creatinine Ratio: 13 (ref 12–20)
BUN: 15 mg/dL (ref 6–20)
CO2: 32 mmol/L (ref 21–32)
Calcium: 9.1 mg/dL (ref 8.5–10.1)
Chloride: 106 mmol/L (ref 97–108)
Creatinine: 1.14 mg/dL (ref 0.70–1.30)
Est, Glom Filt Rate: 77 mL/min/{1.73_m2} (ref >60)
Globulin: 3.2 g/dL (ref 2.0–4.0)
Glucose: 94 mg/dL (ref 65–100)
Potassium: 3.3 mmol/L — ABNORMAL LOW (ref 3.5–5.1)
Sodium: 141 mmol/L (ref 136–145)
Total Bilirubin: 0.4 mg/dL (ref 0.2–1.0)
Total Protein: 7 g/dL (ref 6.4–8.2)

## 2024-05-04 LAB — URINALYSIS WITH MICROSCOPIC
BACTERIA, URINE: NEGATIVE /HPF
Bilirubin, Urine: NEGATIVE
Blood, Urine: NEGATIVE
Glucose, Ur: NEGATIVE mg/dL
Ketones, Urine: NEGATIVE mg/dL
Leukocyte Esterase, Urine: NEGATIVE
Nitrite, Urine: NEGATIVE
Protein, UA: NEGATIVE mg/dL
Specific Gravity, UA: 1.005 (ref 1.003–1.030)
Urobilinogen, Urine: 0.2 EU/dL (ref 0.2–1.0)
pH, Urine: 7.5 (ref 5.0–8.0)

## 2024-05-04 LAB — MAGNESIUM: Magnesium: 2.2 mg/dL (ref 1.6–2.4)

## 2024-05-04 LAB — URINE CULTURE HOLD SAMPLE

## 2024-05-04 MED ORDER — LACTATED RINGERS IV BOLUS
INTRAVENOUS | Status: AC
Start: 2024-05-04 — End: 2024-05-04
  Administered 2024-05-04: 02:00:00 1000 mL via INTRAVENOUS

## 2024-05-04 MED ORDER — POTASSIUM CHLORIDE ER 10 MEQ PO TBCR
10 | Freq: Once | ORAL | Status: AC
Start: 2024-05-04 — End: 2024-05-03
  Administered 2024-05-04: 02:00:00 40 meq via ORAL

## 2024-05-04 MED FILL — POTASSIUM CHLORIDE ER 10 MEQ PO TBCR: 10 MEQ | ORAL | Qty: 4 | Fill #0

## 2024-05-04 MED FILL — LACTATED RINGERS IV SOLN: INTRAVENOUS | Qty: 1000 | Fill #0

## 2024-05-04 NOTE — Consults (Signed)
 Session ID: [id number]  Language: Spanish  Interpreter ID: #[id number]  Interpreter Name: Audry Leavell

## 2024-05-04 NOTE — Consults (Signed)
 Session ID: [id number]  Language: Spanish  Interpreter ID: #[id number]  Interpreter Name: Audry Leavell

## 2024-05-05 LAB — EKG 12-LEAD
Atrial Rate: 61 {beats}/min
Diagnosis: NORMAL
P Axis: 103 degrees
P-R Interval: 132 ms
Q-T Interval: 412 ms
QRS Duration: 96 ms
QTc Calculation (Bazett): 414 ms
R Axis: 48 degrees
T Axis: 42 degrees
Ventricular Rate: 61 {beats}/min
